# Patient Record
Sex: Female | Born: 1960 | Race: Black or African American | Marital: Single | Smoking: Current every day smoker
Health system: Northeastern US, Academic
[De-identification: ages and names within clinical notes are randomized; demographics above are authoritative.]

## PROBLEM LIST (undated history)

## (undated) DIAGNOSIS — I10 Essential (primary) hypertension: Secondary | ICD-10-CM

---

## 2019-05-14 ENCOUNTER — Encounter: Payer: Medicare Other | Admitting: Cardiology

## 2019-05-14 ENCOUNTER — Inpatient Hospital Stay
Admission: EM | Admit: 2019-05-14 | Discharge: 2019-05-17 | DRG: 178 | Disposition: A | Discharge status: Home / Self Care | Payer: Medicare Other | Source: Ambulatory Visit | Attending: Internal Medicine | Admitting: Internal Medicine

## 2019-05-14 ENCOUNTER — Other Ambulatory Visit: Payer: Self-pay | Admitting: Cardiology

## 2019-05-14 ENCOUNTER — Ambulatory Visit
Admission: AD | Admit: 2019-05-14 | Discharge: 2019-05-14 | Disposition: A | Discharge status: Other Acute Inpatient Hospital | Payer: Medicare Other | Source: Ambulatory Visit | Attending: Thoracic Diseases | Admitting: Thoracic Diseases

## 2019-05-14 ENCOUNTER — Ambulatory Visit: Admit: 2019-05-14 | Discharge: 2019-05-14 | Disposition: A | Discharge status: Home / Self Care | Payer: Medicare Other

## 2019-05-14 ENCOUNTER — Encounter: Payer: Self-pay | Admitting: Emergency Medicine

## 2019-05-14 DIAGNOSIS — R05 Cough: Secondary | ICD-10-CM

## 2019-05-14 DIAGNOSIS — Z20828 Contact with and (suspected) exposure to other viral communicable diseases: Secondary | ICD-10-CM | POA: Insufficient documentation

## 2019-05-14 DIAGNOSIS — R0602 Shortness of breath: Secondary | ICD-10-CM

## 2019-05-14 DIAGNOSIS — Z8619 Personal history of other infectious and parasitic diseases: Secondary | ICD-10-CM

## 2019-05-14 DIAGNOSIS — I10 Essential (primary) hypertension: Secondary | ICD-10-CM | POA: Diagnosis present

## 2019-05-14 DIAGNOSIS — R531 Weakness: Secondary | ICD-10-CM

## 2019-05-14 DIAGNOSIS — F17210 Nicotine dependence, cigarettes, uncomplicated: Secondary | ICD-10-CM | POA: Diagnosis present

## 2019-05-14 DIAGNOSIS — E876 Hypokalemia: Secondary | ICD-10-CM | POA: Diagnosis not present

## 2019-05-14 DIAGNOSIS — R509 Fever, unspecified: Secondary | ICD-10-CM

## 2019-05-14 DIAGNOSIS — J189 Pneumonia, unspecified organism: Secondary | ICD-10-CM | POA: Insufficient documentation

## 2019-05-14 DIAGNOSIS — R000 Tachycardia, unspecified: Secondary | ICD-10-CM

## 2019-05-14 DIAGNOSIS — R5383 Other fatigue: Secondary | ICD-10-CM

## 2019-05-14 DIAGNOSIS — N179 Acute kidney failure, unspecified: Secondary | ICD-10-CM | POA: Diagnosis not present

## 2019-05-14 DIAGNOSIS — R61 Generalized hyperhidrosis: Secondary | ICD-10-CM

## 2019-05-14 DIAGNOSIS — A481 Legionnaires' disease: Principal | ICD-10-CM | POA: Diagnosis present

## 2019-05-14 HISTORY — DX: Essential (primary) hypertension: I10

## 2019-05-14 LAB — CBC AND DIFFERENTIAL
Baso # K/uL: 0.1 10*3/uL (ref 0.0–0.1)
Basophil %: 0.6 %
Eos # K/uL: 0 10*3/uL (ref 0.0–0.4)
Eosinophil %: 0 %
Hematocrit: 45 % (ref 34–45)
Hemoglobin: 14.3 g/dL (ref 11.2–15.7)
IMM Granulocytes #: 0.2 10*3/uL — ABNORMAL HIGH (ref 0.0–0.0)
IMM Granulocytes: 1.4 %
Lymph # K/uL: 1.9 10*3/uL (ref 1.2–3.7)
Lymphocyte %: 12.9 %
MCH: 25 pg — ABNORMAL LOW (ref 26–32)
MCHC: 32 g/dL (ref 32–36)
MCV: 78 fL — ABNORMAL LOW (ref 79–95)
Mono # K/uL: 1 10*3/uL — ABNORMAL HIGH (ref 0.2–0.9)
Monocyte %: 6.8 %
Neut # K/uL: 11.3 10*3/uL — ABNORMAL HIGH (ref 1.6–6.1)
Nucl RBC # K/uL: 0 10*3/uL (ref 0.0–0.0)
Nucl RBC %: 0 /100 WBC (ref 0.0–0.2)
Platelets: 179 10*3/uL (ref 160–370)
RBC: 5.8 MIL/uL — ABNORMAL HIGH (ref 3.9–5.2)
RDW: 15.7 % — ABNORMAL HIGH (ref 11.7–14.4)
Seg Neut %: 78.3 %
WBC: 14.5 10*3/uL — ABNORMAL HIGH (ref 4.0–10.0)

## 2019-05-14 LAB — POCT URINALYSIS DIPSTICK
Exp date: 5
Glucose,UA POCT: NORMAL mg/dL
Leuk Esterase,UA POCT: NEGATIVE
Lot #: 43799603
Nitrite,UA POCT: NEGATIVE
PH,UA POCT: 5 (ref 5–8)
Specific gravity,UA POCT: 1.015 (ref 1.002–1.030)
Urobilinogen,UA: 4 mg/dL

## 2019-05-14 LAB — LACTATE, VENOUS, WHOLE BLOOD: Lactate VEN,WB: 1.8 mmol/L (ref 0.5–2.2)

## 2019-05-14 LAB — BASIC METABOLIC PANEL
Anion Gap: 17 — ABNORMAL HIGH (ref 7–16)
CO2: 25 mmol/L (ref 20–28)
Calcium: 8.7 mg/dL (ref 8.6–10.2)
Chloride: 97 mmol/L (ref 96–108)
Creatinine: 1.89 mg/dL — ABNORMAL HIGH (ref 0.51–0.95)
GFR,Black: 33 * — AB
GFR,Caucasian: 29 * — AB
Glucose: 131 mg/dL — ABNORMAL HIGH (ref 60–99)
Lab: 42 mg/dL — ABNORMAL HIGH (ref 6–20)
Potassium: 3.1 mmol/L — ABNORMAL LOW (ref 3.3–5.1)
Sodium: 139 mmol/L (ref 133–145)

## 2019-05-14 LAB — RSV PCR: RSV PCR: 0

## 2019-05-14 LAB — PERFORMING LAB

## 2019-05-14 LAB — INFLUENZA A: Influenza A PCR: 0

## 2019-05-14 LAB — NT-PRO BNP: NT-pro BNP: <50 pg/mL (ref 0–900)

## 2019-05-14 LAB — COVID-19 PCR

## 2019-05-14 LAB — INFLUENZA B PCR: Influenza B PCR: 0

## 2019-05-14 LAB — COVID-19 NAAT (PCR): COVID-19 NAAT (PCR): NEGATIVE

## 2019-05-14 MED ORDER — DOXYCYCLINE 100 MG / 110 ML NS *I*
100.0000 mg | Freq: Once | INTRAVENOUS | Status: AC
Start: 2019-05-14 — End: 2019-05-15
  Administered 2019-05-14: 100 mg via INTRAVENOUS
  Filled 2019-05-14: qty 100

## 2019-05-14 MED ORDER — DEXTROSE 5 % FLUSH FOR PUMPS *I*
0.0000 mL/h | INTRAVENOUS | Status: DC | PRN
Start: 2019-05-14 — End: 2019-05-17

## 2019-05-14 MED ORDER — LISINOPRIL 5 MG PO TABS *I*
10.0000 mg | ORAL_TABLET | Freq: Every day | ORAL | Status: DC
Start: 2019-05-15 — End: 2019-05-14

## 2019-05-14 MED ORDER — SODIUM CHLORIDE 0.9 % IV BOLUS *I*
500.0000 mL | Freq: Once | Status: AC
Start: 2019-05-14 — End: 2019-05-14
  Administered 2019-05-14: 500 mL via INTRAVENOUS

## 2019-05-14 MED ORDER — SODIUM CHLORIDE 0.9 % FLUSH FOR PUMPS *I*
0.0000 mL/h | INTRAVENOUS | Status: DC | PRN
Start: 2019-05-14 — End: 2019-05-17
  Administered 2019-05-15 (×2): 30 mL/h
  Administered 2019-05-15: 30 mL/h via INTRAVENOUS

## 2019-05-14 MED ORDER — ACETAMINOPHEN 325 MG PO TABS *I*
650.0000 mg | ORAL_TABLET | Freq: Once | ORAL | Status: AC
Start: 2019-05-14 — End: 2019-05-14
  Administered 2019-05-14: 650 mg via ORAL

## 2019-05-14 MED ORDER — CEFTRIAXONE SODIUM 1 G IN STERILE WATER 10ML SYRINGE *I*
1000.0000 mg | INTRAVENOUS | Status: DC
Start: 2019-05-15 — End: 2019-05-16
  Administered 2019-05-15: 1000 mg via INTRAVENOUS
  Filled 2019-05-14: qty 10

## 2019-05-14 MED ORDER — DOXYCYCLINE 100 MG / 110 ML NS *I*
100.0000 mg | Freq: Two times a day (BID) | INTRAVENOUS | Status: DC
Start: 2019-05-15 — End: 2019-05-17
  Administered 2019-05-15 – 2019-05-17 (×5): 100 mg via INTRAVENOUS
  Filled 2019-05-14 (×7): qty 100

## 2019-05-14 MED ORDER — ACETAMINOPHEN 325 MG PO TABS *I*
650.0000 mg | ORAL_TABLET | Freq: Four times a day (QID) | ORAL | Status: DC | PRN
Start: 2019-05-14 — End: 2019-05-15
  Administered 2019-05-15: 650 mg via ORAL
  Filled 2019-05-14: qty 2

## 2019-05-14 MED ORDER — SODIUM CHLORIDE 0.9 % FLUSH FOR PUMPS *I*
0.0000 mL/h | INTRAVENOUS | Status: DC | PRN
Start: 2019-05-14 — End: 2019-05-17

## 2019-05-14 MED ORDER — AMLODIPINE BESYLATE 5 MG PO TABS *I*
5.0000 mg | ORAL_TABLET | Freq: Every day | ORAL | Status: DC
Start: 2019-05-15 — End: 2019-05-17
  Administered 2019-05-15 – 2019-05-17 (×3): 5 mg via ORAL
  Filled 2019-05-14 (×3): qty 1

## 2019-05-14 MED ORDER — CEFTRIAXONE SODIUM 1 GM IV/IJ SOLR *WRAPPED*
1.0000 g | Freq: Once | INTRAMUSCULAR | Status: AC
Start: 2019-05-14 — End: 2019-05-14
  Administered 2019-05-14: 1 g via INTRAMUSCULAR

## 2019-05-14 MED ORDER — ENOXAPARIN SODIUM 40 MG/0.4ML IJ SOSY *I*
40.0000 mg | PREFILLED_SYRINGE | Freq: Every day | INTRAMUSCULAR | Status: DC
Start: 2019-05-14 — End: 2019-05-17
  Administered 2019-05-14 – 2019-05-16 (×3): 40 mg via SUBCUTANEOUS
  Filled 2019-05-14 (×3): qty 0.4

## 2019-05-14 MED ORDER — CEFTRIAXONE SODIUM 1 G IN STERILE WATER 10ML SYRINGE *I*
1000.0000 mg | Freq: Once | INTRAVENOUS | Status: AC
Start: 2019-05-14 — End: 2019-05-14
  Administered 2019-05-14: 1000 mg via INTRAVENOUS
  Filled 2019-05-14: qty 10

## 2019-05-14 MED ORDER — POTASSIUM CHLORIDE CRYS CR 20 MEQ PO TBCR *I*
40.0000 meq | ORAL_TABLET | Freq: Once | ORAL | Status: AC
Start: 2019-05-14 — End: 2019-05-14
  Administered 2019-05-14: 40 meq via ORAL
  Filled 2019-05-14: qty 2

## 2019-05-14 MED ORDER — ENOXAPARIN SODIUM 40 MG/0.4ML IJ SOSY *I*
40.0000 mg | PREFILLED_SYRINGE | Freq: Every day | INTRAMUSCULAR | Status: DC
Start: 2019-05-14 — End: 2019-05-14

## 2019-05-14 MED ORDER — ENOXAPARIN SODIUM 30 MG/0.3ML IJ SOSY *I*
30.0000 mg | PREFILLED_SYRINGE | Freq: Every day | INTRAMUSCULAR | Status: DC
Start: 2019-05-14 — End: 2019-05-14

## 2019-05-14 NOTE — ED Triage Notes (Signed)
Pt c/o weakness, loss of appetite, fatigue, insomnia, and diaphoresis x6-7 days, states she had covid-19 in July.       Triage Note   Allen Kell, RN

## 2019-05-14 NOTE — ED Notes (Signed)
Bed: A-06L  Expected date:   Expected time:   Means of arrival:   Comments:

## 2019-05-14 NOTE — ED Notes (Signed)
05/14/19 1340   Expected Call-In Information   ED Service [facility] Adult Call-in   Call received from [place]? No   Pt Info note/Reason for sending weakness, loss of apetite, fatigue for 6-7 days, covid tested today   Pt Coming from Urgent Care   Temp 37.7 C (99.9 F)   Call reported to cp

## 2019-05-14 NOTE — ED Provider Notes (Signed)
History     Chief Complaint   Patient presents with    Pneumonia     pt sent in from urgent care,  for pnuemonia, and tachycardia.  pt is diaphoretic in triage.  pt is covid positive as of today.     Cassandra Randall is a 58 y.o. female with h/o HTN, smoker.     She has been feel generally weak for about he past 4-5 days. So weak that she has no interest in going outside to smoke cigarettes or smoke marijuana (her sister does not allow her to smoke indoors). She has had decreased appetite for the past two days. Associated with fever, sweating, a cough, "slight" shortness of breath.    She lives in [place] and is visiting [place]. She traveled here about 8 days ago.     She had a Covid-19 infection in July.           Medical/Surgical/Family History     Past Medical History:   Diagnosis Date    Hypertension         There is no problem list on file for this patient.           History reviewed. No pertinent surgical history.  No family history on file.       Social History     Tobacco Use    Smoking status: Current Every Day Smoker    Smokeless tobacco: Never Used   Substance Use Topics    Alcohol use: Not on file    Drug use: Yes     Types: Marijuana     Living Situation     Questions Responses    Patient lives with     Homeless     Caregiver for other family member     External Services     Employment     Domestic Violence Risk                 Review of Systems   Review of Systems   Constitutional: Positive for appetite change, diaphoresis, fatigue and fever.   HENT: Negative for sore throat.    Eyes: Negative for visual disturbance.   Respiratory: Positive for cough. Negative for shortness of breath.    Cardiovascular: Negative for chest pain.   Gastrointestinal: Negative for abdominal pain, nausea and vomiting.   Genitourinary: Negative for decreased urine volume and dysuria.   Skin: Negative for rash.   Neurological: Negative for syncope and light-headedness.   Psychiatric/Behavioral: Negative for  confusion.       Physical Exam     Triage Vitals  Triage Start: Start, (05/14/19 1409)   First Recorded BP: 114/65, Resp: 18, Temp: 36.7 C (98 F), Temp src: TEMPORAL Oxygen Therapy SpO2: 98 %, Oximetry Source: Rt Hand, O2 Device: None (Room air), Heart Rate: (!) 117, (05/14/19 1410)  .  First Pain Reported  0-10 Scale: 0, (05/14/19 1410)       Physical Exam  Vitals signs and nursing note reviewed.   Constitutional:       General: She is not in acute distress.     Appearance: She is well-developed. She is diaphoretic.   HENT:      Head: Normocephalic and atraumatic.      Right Ear: External ear normal.      Left Ear: External ear normal.   Eyes:      General: No scleral icterus.        Right eye: No discharge.  Left eye: No discharge.      Conjunctiva/sclera: Conjunctivae normal.   Neck:      Musculoskeletal: Normal range of motion and neck supple.   Cardiovascular:      Rate and Rhythm: Regular rhythm. Tachycardia present.      Heart sounds: Normal heart sounds.      Comments: Tachycardia at arrival. HR in the 90s on telemetry during exam.   Pulmonary:      Effort: Pulmonary effort is normal. No respiratory distress.      Breath sounds: Normal breath sounds.      Comments: Breathing easily and comfortably. Speaking in full sentences without issue.   Abdominal:      General: There is no distension.   Musculoskeletal: Normal range of motion.   Skin:     General: Skin is warm.   Neurological:      Mental Status: She is alert.      Motor: No abnormal muscle tone.   Psychiatric:         Behavior: Behavior normal.         Medical Decision Making   Patient seen by me on:  05/14/2019    Assessment:  Cassandra Randall is a 58 y.o. female who presents with fever, cough, generalized fatigue, and decreased appetite. She has mild tachycardia presently with otherwise normal vitals and no fever (she did receive tylenol at urgent care). Overall, she is not toxic appearing or in any acute distress, she is breathing comfortably  and maintaining an adequate spO2 on room air. Her set of symptoms are the most suspicious for Covid; however, she reports having a Covid infection in July. This could alternatively be a non-Covid viral respiratory infection or CAP. Will check labs and a covid swab during her visit to evaluate further.     Differential diagnosis:  Viral illness, URI, Covid, PNA, leukocytosis, metabolic disturbance.     Plan:  Diagnostics: cbc, bmp, covid swab; CXR from UC reviewed   Therapeutic: IVFs      EKG Interpretation: Rhythm strip was interpreted by me: Normal Sinus Rhythm, rate 99               Salem Senate, MD          Salem Senate, MD  05/14/19 1530

## 2019-05-14 NOTE — ED Notes (Signed)
Report Given To  Kieth Brightly OU RN      Descriptive Sentence / Reason for Admission   Admit for PNA      Active Issues / Relevant Events   A&O  Room Air  Independent  20g R wrist  20g L hand  Doxy running  Pills whole      To Do List  Medications per [facility]  Order Kardex  Urine      Anticipatory Guidance / Discharge Planning  Admit  From home

## 2019-05-14 NOTE — H&P (Signed)
MEDICINE HISTORY & PHYSICAL    SUBJECTIVE:  05/14/2019  2:21 PM    CC: shortness of breath    HPI: Elody Kleinsasser is a 58 y.o. female with hx of hypertension, tobacco use, and COVID pneumonia in July presenting with 5 days of worsening dyspnea, cough, and warmth feeling.     She is visiting from [place] and has been here for past 10 days. She found out that she had exposure to COVID from her cousin. Reports cough, shortness of breath, and maybe fever. Denies orthopnea or chest pain. Continues to use cigarettes.She presented to urgent care and due to concerns of LLL pneumonia that was detected on chest xray she was transferred to [facility] for further evaluation.     Here, she is COVID/influenaze negative. Lactate 1.8, Na 139, K3.1, Cr 1.89 (unknown baseline), WBC 14.5. Chest Xray again showing LLL opacity concerning for pneumonia. She was started on Ceftriaxone and Doxy.     Otherwise reports headaches that are chronic. Denies chest pain, palpitations, or known cardiac disease. Denies lower extremity swelling. Denies urinary symptoms and no evidence of bleeding. Reports intermittent constipation.     PMHx:  Past Medical History:   Diagnosis Date    Hypertension        PSHx:  History reviewed. No pertinent surgical history.    FHx:  No family history on file.    SHx:  Social History     Socioeconomic History    Marital status: Single     Spouse name: Not on file    Number of children: Not on file    Years of education: Not on file    Highest education level: Not on file   Occupational History    Not on file   Tobacco Use    Smoking status: Current Every Day Smoker    Smokeless tobacco: Never Used   Substance and Sexual Activity    Alcohol use: Not on file    Drug use: Yes     Types: Marijuana    Sexual activity: Not on file   Social History Narrative    Not on file         Allergies:  has No Known Allergies (drug, envir, food or latex).    Medications:  Home Meds:   Prior to Admission medications    Medication  Sig Start Date End Date Taking? Authorizing Provider   amLODIPine (NORVASC) 10 MG tablet Take 5 mg by mouth daily    Yes [provider]   lisinopril (PRINIVIL,ZESTRIL) 10 MG tablet Take 10 mg by mouth daily   Yes [provider]     Scheduled Meds:    cefTRIAXone  1,000 mg Intravenous Once    doxycycline IV  100 mg Intravenous Once    [START ON 05/15/2019] amLODIPine  5 mg Oral Daily    [START ON 05/15/2019] lisinopril  10 mg Oral Daily    enoxaparin  40 mg Subcutaneous Daily @ 2100    [START ON 05/15/2019] cefTRIAXone  1,000 mg Intravenous Q24H    [START ON 05/15/2019] doxycycline IV  100 mg Intravenous BID    potassium chloride SA  40 mEq Oral Once     Continuous Infusions:   PRN Meds: sodium chloride, dextrose, sodium chloride, dextrose, acetaminophen  PTA Meds: (Not in a hospital admission)        ROS:   - see per HPI     OBJECTIVE:    Vital Signs:  BP: (114-141)/(65-89)   Temp:  [  36.7 C (98 F)-37.7 C (99.9 F)]   Temp src: Oral (12/25 1440)  Heart Rate:  [98-125]   Resp:  [18-24]   SpO2:  [98 %-100 %]   Height:  [165.1 cm (5\' 5" )]   Weight:  [81.6 kg (180 lb)]   Patient Vitals for the past 72 hrs:   BP Temp Temp src Pulse Resp SpO2 Height Weight   05/14/19 1651 141/89 37.3 C (99.1 F) -- 98 22 100 % -- --   05/14/19 1554 128/74 -- -- 98 18 99 % -- --   05/14/19 1440 -- 37.5 C (99.5 F) Oral -- -- -- -- --   05/14/19 1410 114/65 36.7 C (98 F) TEMPORAL (!) 117 18 98 % 1.651 m (5\' 5" ) 81.6 kg (180 lb)              Intake/Output Summary (Last 24 hours) at 05/14/2019 2006  Last data filed at 05/14/2019 1643  Gross per 24 hour   Intake 99 ml   Output --   Net 99 ml     Weight:   Last 4 Weights    05/14/19 1410   Weight: 81.6 kg (180 lb)     Auscultation done with disposable stethoscope.     Gen:  NAD, appears stated age   HENT: wearing a mask  Lungs: Clear to auscultation, no dullness to percusion, mild rales in left lung  Heart: RRR   Abd: BS+, soft, NT, ND, no HSM  Ext: No  e/c/c.  Neuro: A&Ox3, grossly normal     Lab Results:    No intake/output data recorded.  Recent Labs     05/14/19  1555   Sodium 139   Potassium 3.1*   Chloride 97   CO2 25   Creatinine 1.89*   Calcium 8.7   Glucose 131*     No components found with this basename: BUN    Recent Labs   Lab 05/14/19  1555   Calcium 8.7     Recent Labs     05/14/19  1555   WBC 14.5*   RBC 5.8*   Hemoglobin 14.3   Hematocrit 45   MCV 78*   RDW 15.7*   Platelets 179     No results for input(s): ALT, AST, INR in the last 168 hours.    No components found with this basename: ALKPHOS, BILITOT, BILIDIR, PROT, LABALBU, APT  No components found with this basename: CKTOTAL, TROPONINI, TROPONINT, CKMBINDEX  No results for input(s): PGLU in the last 168 hours.          ASSESSMENT/PLAN: Karenann CaiShantia Isensee is a 58 y.o. female w/ a history of hypertension, tobacco use, COVID pneumonia in July presenting with dyspnea and subjective fever with known recent COVID exposure. She tested negative for COVID-19/influenzae; however, chest Xray is concerning for LLL pneumonia.     Dyspnea, likely CAP  - Cont Ceftriaxone/Doxy  - Strep pneumo, legionella   - No evidence of heart failure on exam, will obtain BNP   - Tylenol prn     Elevated Cr  - unknown baseline   - UA   - would obtain records in am   - stop lisinopril for now     Hypertension  - cont home amlodipine   - hold lisinopril as mentioned above    F - oral   E - daily   N - regular     Dvt ppx: regular     Code Status: Full  Clare Charon, MD     This encounter was not billed

## 2019-05-14 NOTE — ED Notes (Signed)
Report called to [facility] at [facility] ED, pt headed there via private vehicle. Discharge instructions reviewed, pt verbalized understanding. All questions answered. Pt left with all belongings.

## 2019-05-14 NOTE — ED Notes (Signed)
Bed: A-06R  Expected date:   Expected time:   Means of arrival:   Comments:

## 2019-05-14 NOTE — UC Provider Note (Signed)
History     Chief Complaint   Patient presents with    Illness     Pt c/o weakness, loss of appetite, fatigue, insomnia, and diaphoresis x6-7 days, states she had covid-19 in July.     58 year old female who recently traveled here from [place] to help her sister following surgery performed at [facility] presents to urgent care for further evaluation of fatigue, generalized weakness, poor p.o. intake, insomnia and diaphoresis for the past 6 to 7 days.  Patient denies any known sick contacts.  Patient did have Covid infection back in July.  Patient denies any other associated symptoms.  No cough, chest pressure, chest tightness or shortness of breath.  Patient does endorse dark appearing urine.  No burning, urgency or frequency.  No abdominal pain, nausea or vomiting.  No back or flank pain.  Patient denies any underlying cardiopulmonary disease.  Patient is a current smoker.  She does also have history of HTN.           Medical/Surgical/Family History     Past Medical History:   Diagnosis Date    Hypertension         There is no problem list on file for this patient.           History reviewed. No pertinent surgical history.  No family history on file.       Social History     Tobacco Use    Smoking status: Current Every Day Smoker    Smokeless tobacco: Never Used   Substance Use Topics    Alcohol use: Not on file    Drug use: Yes     Types: Marijuana     Living Situation     Questions Responses    Patient lives with     Homeless     Caregiver for other family member     External Services     Employment     Domestic Violence Risk                 Review of Systems   Review of Systems   Constitutional: Positive for activity change, appetite change, diaphoresis and fever. Negative for chills.   HENT: Negative for congestion.    Respiratory: Negative for chest tightness and shortness of breath.    Cardiovascular: Negative for chest pain and palpitations.   Gastrointestinal: Negative for abdominal pain,  nausea and vomiting.   Musculoskeletal: Positive for myalgias. Negative for neck stiffness.   Skin: Negative for color change and rash.   Neurological: Negative for dizziness, light-headedness and headaches.   Psychiatric/Behavioral: Negative for confusion.       Physical Exam   Triage Vitals  Triage Start: Start, (05/14/19 1251)   First Recorded BP: 123/73, Resp: 24, Temp: 37.4 C (99.4 F) Oxygen Therapy SpO2: 100 %, Heart Rate: (!) 125, (05/14/19 1252)  .  First Pain Reported  0-10 Scale: 0, (05/14/19 1252)       Physical Exam  Vitals signs and nursing note reviewed.   Constitutional:       Appearance: She is ill-appearing and diaphoretic.   HENT:      Head: Normocephalic.   Eyes:      Conjunctiva/sclera: Conjunctivae normal.   Neck:      Musculoskeletal: Normal range of motion.   Cardiovascular:      Rate and Rhythm: Tachycardia present.      Heart sounds: Normal heart sounds, S1 normal and S2 normal.   Pulmonary:  Effort: No respiratory distress.      Breath sounds: Examination of the left-middle field reveals rhonchi. Examination of the right-lower field reveals decreased breath sounds. Examination of the left-lower field reveals decreased breath sounds and rhonchi. Decreased breath sounds and rhonchi present.   Abdominal:      Palpations: Abdomen is soft.      Tenderness: There is no abdominal tenderness.   Musculoskeletal: Normal range of motion.   Skin:     General: Skin is warm.      Coloration: Skin is not pale.   Neurological:      Mental Status: She is alert and oriented to person, place, and time.      GCS: GCS eye subscore is 4. GCS verbal subscore is 5. GCS motor subscore is 6.   Psychiatric:         Mood and Affect: Mood normal.          Medical Decision Making        Initial Evaluation:  ED First Provider Contact     Date/Time Event User Comments    05/14/19 1247 ED First Provider Contact Daleiza Bacchi A Initial Face to Face Provider Contact          Patient was seen on:  05/14/2019        Assessment:  58 y.o.female comes to the Urgent Care [facility] with fatigue, decreased PO intake, diaphoresis and fever for the past 5-6 days. Patient Covid +in July. Just traveled here from [place].     Differential Diagnosis includes:  PNA, early sepsis, dehydration, AKI, electrolyte abnormality, covid     Plan: Patient with fatigue, fever, decreased PO intake and questionable pneumonia vs pulmonary edema on chest xray imaging. Patient tachycardiac and diaphoretic on exam despite Tylenol. Mental status intact.  Urine with blood, bilrubin and large amount of protein. EKG reveals ST.     Probable PNA of LLL. No change in vital signs after Tylenol and watchful waiting. Writer not convinced patient can tolerate trial of PO antibiotics at home for treatment of suspected PNA. Recommended further work-up in ED. Patient hesitant but agreeable after discussing reasoning as documented below...    Patient needs lab work to rule out early sepsis or AKI prior to sending home on PO antibiotics. Also possible IV rehydration.  Covid obtained while here in UC. 1 gram IV Rocephin given at 1325. Patient is mentating well but no change in vital signs throughout visit and she remains visibly diaphoretic (patient tells me she is diaphoretic at baseline). Oxygen and RR stable.           Orders Placed This Encounter    Bacterial urine culture    *Chest standard frontal and lateral views    COVID-19 PCR    POCT urinalysis dipstick    EKG 12 lead (initial)    acetaminophen (TYLENOL) tablet 650 mg    cefTRIAXone (ROCEPHIN) 100 mg/ml injection 1 g       Recent Results (from the past 24 hour(s))   POCT urinalysis dipstick    Collection Time: 05/14/19  1:16 PM   Result Value Ref Range    Specific gravity,UA POCT 1.015 1.002 - 1.030    PH,UA POCT 5.0 5 - 8    Leuk Esterase,UA POCT Negative Negative    Nitrite,UA POCT Negative Negative    Protein,UA POCT ++ 100mg /dL (!) Negative mg/dL    Glucose,UA POCT Normal Normal mg/dL     Ketones,UA POCT + small Negative mg/dL  Urobilinogen,UA 4 mg/dL Less than 1 mg/dL    Bilirubin,Ur + (!) Negative    Blood,UA POCT About 250 (!) Negative    Exp date 05 21     Lot # [id number]            Final Diagnosis    ICD-10-CM ICD-9-CM   1. Pneumonia of left lower lobe due to infectious organism  J18.9 486       Current Discharge Medication List            Final Diagnosis  Final diagnoses:   [J18.9] Pneumonia of left lower lobe due to infectious organism (Primary)         Artelia LarocheElizabeth A Sarha Bartelt, NP              Artelia LarocheHayes, Taejah Ohalloran A, NP  05/14/19 1402

## 2019-05-14 NOTE — ED Triage Notes (Signed)
pt sent in from urgent care,  for pnuemonia, and tachycardia.  pt is diaphoretic in triage.  pt is covid positive as of today.    Triage Note   Elliot Cousin, RN

## 2019-05-15 DIAGNOSIS — I10 Essential (primary) hypertension: Secondary | ICD-10-CM

## 2019-05-15 DIAGNOSIS — A481 Legionnaires' disease: Principal | ICD-10-CM

## 2019-05-15 DIAGNOSIS — R042 Hemoptysis: Secondary | ICD-10-CM

## 2019-05-15 LAB — HEPATIC FUNCTION PANEL
ALT: 36 U/L — ABNORMAL HIGH (ref 0–35)
AST: 72 U/L — ABNORMAL HIGH (ref 0–35)
Albumin: 3.8 g/dL (ref 3.5–5.2)
Alk Phos: 79 U/L (ref 35–105)
Bilirubin,Direct: <0.2 mg/dL (ref 0.0–0.3)
Bilirubin,Total: 0.3 mg/dL (ref 0.0–1.2)
Total Protein: 7.7 g/dL (ref 6.3–7.7)

## 2019-05-15 LAB — BASIC METABOLIC PANEL
Anion Gap: 15 (ref 7–16)
CO2: 25 mmol/L (ref 20–28)
Calcium: 8.6 mg/dL (ref 8.6–10.2)
Chloride: 98 mmol/L (ref 96–108)
Creatinine: 1.55 mg/dL — ABNORMAL HIGH (ref 0.51–0.95)
GFR,Black: 42 * — AB
GFR,Caucasian: 36 * — AB
Glucose: 99 mg/dL (ref 60–99)
Lab: 40 mg/dL — ABNORMAL HIGH (ref 6–20)
Sodium: 138 mmol/L (ref 133–145)

## 2019-05-15 LAB — CBC AND DIFFERENTIAL
Baso # K/uL: 0.1 10*3/uL (ref 0.0–0.1)
Basophil %: 0.5 %
Eos # K/uL: 0 10*3/uL (ref 0.0–0.4)
Eosinophil %: 0 %
Hematocrit: 45 % (ref 34–45)
Hemoglobin: 14.1 g/dL (ref 11.2–15.7)
IMM Granulocytes #: 0.2 10*3/uL — ABNORMAL HIGH (ref 0.0–0.0)
IMM Granulocytes: 1.5 %
Lymph # K/uL: 2.9 10*3/uL (ref 1.2–3.7)
Lymphocyte %: 18.1 %
MCH: 25 pg — ABNORMAL LOW (ref 26–32)
MCHC: 32 g/dL (ref 32–36)
MCV: 78 fL — ABNORMAL LOW (ref 79–95)
Mono # K/uL: 1.1 10*3/uL — ABNORMAL HIGH (ref 0.2–0.9)
Monocyte %: 7.3 %
Neut # K/uL: 11.4 10*3/uL — ABNORMAL HIGH (ref 1.6–6.1)
Nucl RBC # K/uL: 0 10*3/uL (ref 0.0–0.0)
Nucl RBC %: 0 /100 WBC (ref 0.0–0.2)
Platelets: 192 10*3/uL (ref 160–370)
RBC: 5.7 MIL/uL — ABNORMAL HIGH (ref 3.9–5.2)
RDW: 15.7 % — ABNORMAL HIGH (ref 11.7–14.4)
Seg Neut %: 72.6 %
WBC: 15.7 10*3/uL — ABNORMAL HIGH (ref 4.0–10.0)

## 2019-05-15 LAB — S. PNEUMONIAE ANTIGEN: S. pneumoniae Antigen: 0

## 2019-05-15 LAB — COVID-19 NAAT (PCR): COVID-19 NAAT (PCR): NEGATIVE

## 2019-05-15 LAB — LEGIONELLA ANTIGEN, URINE: Legionella Antigen (Urine): POSITIVE — AB

## 2019-05-15 LAB — COVID-19 PCR

## 2019-05-15 LAB — AEROBIC CULTURE: Aerobic Culture: 0

## 2019-05-15 MED ORDER — ACETAMINOPHEN 500 MG PO TABS *I*
500.0000 mg | ORAL_TABLET | Freq: Once | ORAL | Status: AC
Start: 2019-05-15 — End: 2019-05-15
  Administered 2019-05-15: 500 mg via ORAL
  Filled 2019-05-15: qty 1

## 2019-05-15 MED ORDER — ACETAMINOPHEN 325 MG PO TABS *I*
650.0000 mg | ORAL_TABLET | Freq: Four times a day (QID) | ORAL | Status: DC | PRN
Start: 2019-05-15 — End: 2019-05-17
  Administered 2019-05-15 – 2019-05-16 (×3): 650 mg via ORAL
  Filled 2019-05-15 (×4): qty 2

## 2019-05-15 NOTE — Progress Notes (Signed)
Notified by micro lab that 1/4 culture bottle is positive with large gram + bacilli.  A/P - patient admitted with CAP, on CTX/doxy. Continue the course.

## 2019-05-15 NOTE — Progress Notes (Signed)
05/14/19 1909   [facility] Patient Class Review   Patient Class Review Inpatient

## 2019-05-15 NOTE — Plan of Care (Signed)
Problem: Safety  Goal: Patient will remain free of falls  Outcome: Maintaining    Problem: Pain/Comfort  Goal: Patient’s pain or discomfort is manageable  Outcome: Maintaining    Problem: Nutrition  Goal: Patient’s nutritional status is maintained or improved  Outcome: Maintaining    Problem: Mobility  Goal: Patient’s functional status is maintained or improved  Outcome: Maintaining    Problem: Psychosocial  Goal: Demonstrates ability to cope with illness  Outcome: Maintaining    Problem: Cognitive function  Goal: Cognitive function will be maintained or return to baseline  Outcome: Maintaining

## 2019-05-15 NOTE — Progress Notes (Signed)
Temp at 0004 was 101/9 oral  And tylenol was given at 0055. Rechecked temp at 0253 was 100.6 oral. Text covering provider Dr. Doreen Beam at [phone] andnew order for x1 dose of tylenol at 0400 and clarified current tylenol order. Tylenol given at 0357 and recheck temp at 0643 was 100.8. Text provider to update on pt status, pebding response

## 2019-05-15 NOTE — Progress Notes (Signed)
Current state of emergency related to COVID-19. Documentation completed per hospital emergency response standard.                  Neriah Brott, RN

## 2019-05-16 LAB — CBC AND DIFFERENTIAL
Baso # K/uL: 0.1 10*3/uL (ref 0.0–0.1)
Basophil %: 0.4 %
Eos # K/uL: 0.1 10*3/uL (ref 0.0–0.4)
Eosinophil %: 0.4 %
Hematocrit: 41 % (ref 34–45)
Hemoglobin: 12.9 g/dL (ref 11.2–15.7)
IMM Granulocytes #: 0.3 10*3/uL — ABNORMAL HIGH (ref 0.0–0.0)
IMM Granulocytes: 2.1 %
Lymph # K/uL: 2.2 10*3/uL (ref 1.2–3.7)
Lymphocyte %: 15.9 %
MCH: 25 pg — ABNORMAL LOW (ref 26–32)
MCHC: 32 g/dL (ref 32–36)
MCV: 78 fL — ABNORMAL LOW (ref 79–95)
Mono # K/uL: 1.2 10*3/uL — ABNORMAL HIGH (ref 0.2–0.9)
Monocyte %: 8.6 %
Neut # K/uL: 10.1 10*3/uL — ABNORMAL HIGH (ref 1.6–6.1)
Nucl RBC # K/uL: 0 10*3/uL (ref 0.0–0.0)
Nucl RBC %: 0 /100 WBC (ref 0.0–0.2)
Platelets: 196 10*3/uL (ref 160–370)
RBC: 5.3 MIL/uL — ABNORMAL HIGH (ref 3.9–5.2)
RDW: 15.4 % — ABNORMAL HIGH (ref 11.7–14.4)
Seg Neut %: 72.6 %
WBC: 13.9 10*3/uL — ABNORMAL HIGH (ref 4.0–10.0)

## 2019-05-16 LAB — BASIC METABOLIC PANEL
Anion Gap: 16 (ref 7–16)
CO2: 24 mmol/L (ref 20–28)
Calcium: 8.5 mg/dL — ABNORMAL LOW (ref 8.6–10.2)
Chloride: 97 mmol/L (ref 96–108)
Creatinine: 0.96 mg/dL — ABNORMAL HIGH (ref 0.51–0.95)
GFR,Black: 75 *
GFR,Caucasian: 65 *
Glucose: 106 mg/dL — ABNORMAL HIGH (ref 60–99)
Lab: 21 mg/dL — ABNORMAL HIGH (ref 6–20)
Potassium: 3.1 mmol/L — ABNORMAL LOW (ref 3.3–5.1)
Sodium: 137 mmol/L (ref 133–145)

## 2019-05-16 LAB — PHOSPHORUS: Phosphorus: 1.8 mg/dL — ABNORMAL LOW (ref 2.7–4.5)

## 2019-05-16 LAB — MAGNESIUM: Magnesium: 2.3 mg/dL (ref 1.6–2.5)

## 2019-05-16 MED ORDER — POTASSIUM CHLORIDE CRYS CR 20 MEQ PO TBCR *I*
40.0000 meq | ORAL_TABLET | ORAL | Status: AC
Start: 2019-05-16 — End: 2019-05-16
  Administered 2019-05-16 (×2): 40 meq via ORAL
  Filled 2019-05-16 (×2): qty 2

## 2019-05-16 NOTE — Progress Notes (Signed)
Current state of emergency related to COVID-19. Documentation completed per hospital emergency response standard.

## 2019-05-16 NOTE — Progress Notes (Signed)
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 SL  Lines/Drains? L hand & R wrist  Tele? Box #6-NSR/ST  Pertinent Labs? WBC-13.9 ,bun cr 21 0.96 but improving   Valuables/ Belongings? Clothing, cell phone & purse  Other Issues? + blood culture 1/4 bottles large gram + bacilli, urine + for legionella antigen  Febrile overnight to 101.22F  Fearful of needles      To Do List  Frequency of:  VS:Q4  Pain: QS  Bed Alarm? Zone No  BG? No  Turn? Independent  Incontinence Check? No  Weight? admission  I/O? QS  Labs? As ordered            Anticipatory Guidance / Discharge Planning  Expected D/C disposition?     Barriers to D/C

## 2019-05-16 NOTE — Progress Notes (Signed)
Inpatient Medicine Progress Note    Interval History:  Fever persist, as well as cough with bloody phlegm. Less dyspnea. Diarrhea x 1 this AM    Intake/Output    Intake/Output Summary (Last 24 hours) at 05/16/2019 1421  Last data filed at 05/16/2019 1050  Gross per 24 hour   Intake 570.32 ml   Output --   Net 570.32 ml        Vital Signs:   Temp:  [36.9 C (98.5 F)-38.9 C (102.1 F)] 36.9 C (98.5 F)  Heart Rate:  [81-102] 81  Resp:  [16] 16  BP: (120-158)/(70-80) 158/80       Physical Exam:    General: ill appearing  HEENT: MMM  Cardiovascular: S1,S2, reg rhythm  Pulmonary: CTA b/l posterior chest wall  Gastrointestinal: obese, soft, NT, +BS  Skin: warm  Psych:pleasant mood  Neuro:grossly normal  TFT:DDUKGUR normal    Data:    Recent Labs   Lab 05/16/19  0258 05/15/19  0327 05/14/19  1555   WBC 13.9* 15.7* 14.5*   Hemoglobin 12.9 14.1 14.3   Hematocrit 41 45 45   Platelets 196 192 179     Other Labs:    Recent Labs   Lab 05/16/19  0258 05/15/19  0327 05/14/19  1555   Sodium 137 138 139   Potassium 3.1* CANCELED 3.1*   Chloride 97 98 97   CO2 24 25 25    UN 21* 40* 42*   Creatinine 0.96* 1.55* 1.89*   GFR,Caucasian 65 36* 29*   GFR,Black 75 42* 33*   Glucose 106* 99 131*   Calcium 8.5* 8.6 8.7      X-rays the past 24 hours: *chest Standard Frontal And Lateral Views    Result Date: 05/14/2019  Diffuse bilateral interstitial markings, most advanced at the left lower lobe. Differential diagnosis includes pulmonary edema, versus developing pneumonia in the left lower lobe. END OF IMPRESSION I have personally reviewed the images and the Resident's/Fellow's interpretation and agree with or edited the findings. [facility] submits this DICOM format image data and final report to the [facility], an independent secure electronic health information exchange, on a reciprocally searchable basis (with patient authorization) for a minimum of 12 months after exam date.       Assessment/Plan  Active  Hospital Problems    Diagnosis    Community acquired pneumonia, unspecified laterality      Resolved Hospital Problems   No resolved problems to display.     1. Legionella pneumonia with hemoptysis  -fever persist, leukocytosis improving  -continue doxycycline IV  -stop ceftriaxone    2. AKI  -corrected  -continue to hold lisinopril    3. Hypokalemia and hypophosphatemia  -correcting  -Mg 2.3  -KPhos 30 mMol    4. HTN, BP elevated  -holding lisinopril  -continue amlodipine    5. Tobacco abuse  -cessation counseling     6. PPX: enoxaparin    7. Code status: full code    8. Disposition: home in AM if defervesce   -quarantine as travel to [place] from [place]  -coordinating care and complexity: 35 minutes    Rico Junker, MD   05/16/2019   2:21 PM

## 2019-05-16 NOTE — Continuity of Care (Signed)
COVID COMMUNICATION CHECKLIST     Has the patient identified a spokesperson to receive updates (Name/Phone Number/Relation)? Yes her sister Josue Hector and Verda Mehta              If applicable, has the spokesperson received SW contact information? Yes              If applicable, has there been an established plan for updates by the medical team with the spokesperson? No pt is able to provide updates to family              If applicable, has the spokesperson received updates from the medical team? NA                Does the patient have a means of personal communication with their spokesperson (i.e. smart phone, cell phone, etc)? Has a cell phone     Is the patient aware of iPad availability for communication purposes? no     Has the patient/spokesperson been informed on the visitation policy? yes     Plan for SW updates with spokesperson: as needed.    Kristopher Oppenheim, MSW pager [phone] 05/16/19 4:02 PM

## 2019-05-16 NOTE — Comprehensive Assessment (Signed)
05/16/19 1543   Contact Information   Spokesperson Name Ysabel Stankovich   Relationship to Patient  Sister   Phone Number(s) [phone]   Has Spokesperson been notified of admission? Yes   Method of Notification Verbal via Telephone   Alternate Spokesperson? Yes   Spokesperson Alternate Big Clifty Caul   Relationship to Patient  Sister   Phone Number(s) [phone]   Emergency Contact other than spokesperson? No   Is spokesperson the patient's caregiver after discharge? Yes   Discharge transportation   Ride to/from facility [facility]   Relationship to Patient  Sister     Sw spoke with pt and her sister, pt could not speak with this Sw for long as she needed to use the bathroom however gave this Sw permission to speak with her sister Norreen. Per Josue Hector Pt was visiting her and became sick.  Josue Hector reported pt was independent with ADLs ans walked with out a device at baseline.  Josue Hector can care for pt post discharge.  No other Sw needs identified at this time.  Sw remains available should d/c planning needs arise.  Kristopher Oppenheim, MSW 05/16/19 3:58 PM pager [phone]

## 2019-05-17 LAB — EKG 12-LEAD
P: 48 deg
PR: 136 ms
QRS: -9 deg
QRSD: 81 ms
QT: 326 ms
QTc: 435 ms
Rate: 107 {beats}/min
T: -14 deg

## 2019-05-17 LAB — BASIC METABOLIC PANEL
Anion Gap: 11 (ref 7–16)
CO2: 27 mmol/L (ref 20–28)
Calcium: 8.8 mg/dL (ref 8.6–10.2)
Chloride: 103 mmol/L (ref 96–108)
Creatinine: 0.68 mg/dL (ref 0.51–0.95)
GFR,Black: 111 *
GFR,Caucasian: 96 *
Glucose: 118 mg/dL — ABNORMAL HIGH (ref 60–99)
Lab: 11 mg/dL (ref 6–20)
Potassium: 3.6 mmol/L (ref 3.3–5.1)
Sodium: 141 mmol/L (ref 133–145)

## 2019-05-17 LAB — CBC AND DIFFERENTIAL
Baso # K/uL: 0.1 10*3/uL (ref 0.0–0.1)
Basophil %: 0.7 %
Eos # K/uL: 0.2 10*3/uL (ref 0.0–0.4)
Eosinophil %: 1.6 %
Hematocrit: 40 % (ref 34–45)
Hemoglobin: 12.7 g/dL (ref 11.2–15.7)
IMM Granulocytes #: 0.4 10*3/uL — ABNORMAL HIGH (ref 0.0–0.0)
IMM Granulocytes: 3.3 %
Lymph # K/uL: 3.3 10*3/uL (ref 1.2–3.7)
Lymphocyte %: 25.6 %
MCH: 25 pg — ABNORMAL LOW (ref 26–32)
MCHC: 32 g/dL (ref 32–36)
MCV: 78 fL — ABNORMAL LOW (ref 79–95)
Mono # K/uL: 1.2 10*3/uL — ABNORMAL HIGH (ref 0.2–0.9)
Monocyte %: 8.9 %
Neut # K/uL: 7.8 10*3/uL — ABNORMAL HIGH (ref 1.6–6.1)
Nucl RBC # K/uL: 0 10*3/uL (ref 0.0–0.0)
Nucl RBC %: 0 /100 WBC (ref 0.0–0.2)
Platelets: 235 10*3/uL (ref 160–370)
RBC: 5.2 MIL/uL (ref 3.9–5.2)
RDW: 15.9 % — ABNORMAL HIGH (ref 11.7–14.4)
Seg Neut %: 59.9 %
WBC: 13 10*3/uL — ABNORMAL HIGH (ref 4.0–10.0)

## 2019-05-17 MED ORDER — AZITHROMYCIN 500 MG PO TABS *I*
500.0000 mg | ORAL_TABLET | Freq: Every day | ORAL | 0 refills | Status: AC
Start: 2019-05-17 — End: 2019-05-24

## 2019-05-17 MED ORDER — DOXYCYCLINE HYCLATE 100 MG PO TABS *I*
100.0000 mg | ORAL_TABLET | Freq: Two times a day (BID) | ORAL | 0 refills | Status: AC
Start: 2019-05-17 — End: 2019-05-24

## 2019-05-17 NOTE — Discharge Instructions (Signed)
Brief Summary of Your Hospital Course (including key procedures and diagnostic test results):  You presented to ED with complains of shortness of breath. You have been treated for pneumonia with IV antibiotics. Your symptoms have improved. You will be discharged home today. Please follow all of your discharge instructions.   Please stay in home quarantine until 05/27/18 given that you came from another state.     Your instructions:  Please follow up with your PCP within a week     New Medications:  Please start Azithromycin 500 mg daily by mouth for 7 more days - total of 10 days      Changes to existing medications:  None     What to do after you leave the hospital:    Recommended diet: Regular - No restrictions     Recommended activity: activity as tolerated    Wound Care: none needed    If you experience any of these symptoms within the first 24 hours after discharge:Uncontrolled pain, Chest pain, Shortness of breath, Fever of 101 F. or greater, Chills or Weakness  please follow up with the discharge attending Dr. Jari Favre at phone-number: [phone]    If you experience any of these symptoms 24 hours or more after discharge:  please follow up with your PCP:  No primary care provider on file. None

## 2019-05-17 NOTE — Progress Notes (Signed)
Inpatient Medicine Progress Note    Interval History:  No dyspnea, no further productive cough    Intake/Output  No intake or output data in the 24 hours ending 05/17/19 1621     Vital Signs:   Temp:  [36.4 C (97.5 F)-37.2 C (99 F)] 36.8 C (98.2 F)  Heart Rate:  [80-82] 80  Resp:  [16-18] 16  BP: (120-122)/(68-80) 122/80       Physical Exam:    General: less ill appearing  HEENT: MMM  Cardiovascular: S1,S2, reg rhythm  Pulmonary: CTA b/l posterior chest wall  Gastrointestinal: obese, soft, NT, +BS  Skin: warm  Psych:pleasant mood  Neuro:grossly normal  PYP:PJKDTOI normal    Data:    Recent Labs   Lab 05/17/19  0614 05/16/19  0258 05/15/19  0327   WBC 13.0* 13.9* 15.7*   Hemoglobin 12.7 12.9 14.1   Hematocrit 40 41 45   Platelets 235 196 192     Other Labs:    Recent Labs   Lab 05/17/19  0614 05/16/19  0258 05/15/19  0327   Sodium 141 137 138   Potassium 3.6 3.1* CANCELED   Chloride 103 97 98   CO2 27 24 25    UN 11 21* 40*   Creatinine 0.68 0.96* 1.55*   GFR,Caucasian 96 65 36*   GFR,Black 111 75 42*   Glucose 118* 106* 99   Calcium 8.8 8.5* 8.6      X-rays the past 24 hours: No results found.     Assessment/Plan  Active Hospital Problems    Diagnosis    Community acquired pneumonia, unspecified laterality      Resolved Hospital Problems   No resolved problems to display.     1. Legionella pneumonia with hemoptysis  -fever resolved, leukocytosis improving  -change doxycycline IV to azithro 500 mg po daily to complete 10 day total treatment  -stop ceftriaxone    2. AKI  -corrected  -resume lisinopril    3. Hypokalemia and hypophosphatemia  -correcting  -Mg 2.3  -s/p KPhos 30 mMol    4. HTN, BP elevated  -restart lisinopril  -continue amlodipine    5. Tobacco abuse  -cessation counseling     6. PPX: enoxaparin    7. Code status: full code    8. Disposition: home today  -quarantine as travel to [place] from [place] x 14 days  -coordinating care and complexity of discharge: 35 minutes    [place], MD   05/17/2019   4:21 PM

## 2019-05-17 NOTE — Progress Notes (Signed)
Case d/w Dr. Dallie Piles and made agreement to change antibiotic to Azithromycin 500 mg daily x 7 days to complete 10 day-course of abx.   A/P - [facility] called and verbal given to Dr. Annamaria Boots. Nursing called and advised to print another AVS due to the above change.

## 2019-05-17 NOTE — Progress Notes (Signed)
Current state of emergency related to COVID-19. Documentation completed per hospital emergency response standard.

## 2019-05-17 NOTE — Discharge Summary (Addendum)
Name: Cassandra Randall MRN: [id number] DOB: [birth year]     Admit Date: 05/14/2019   Date of Discharge: 05/17/2019     Patient was accepted for discharge to   Home or Self Care [1]           Discharge Attending Physician: Rico Junker, MD      Hospitalization Summary    CONCISE NARRATIVE:   Cassandra Randall is a 58 y.o. female with hx of hypertension, tobacco use, and COVID pneumonia in July presenting with 5 days of worsening dyspnea, cough, and warmth feeling.  Legionella pneumonia with hemoptysis - fever persist, treated with CTX/doxycycline IV.  Home quarantine until 05/28/19 as traveled to [place] from [place].  Patient will start Azithromycin 500 mg for 7 more days for total of 10 days of antibiotics, f/u with PCP within a week.  Patient discharged home in a safe medical condition on 05/17/19.                XRAY RESULTS:   Chest, 12/25:  Diffuse bilateral interstitial markings, most advanced at the left lower lobe. Differential diagnosis includes pulmonary edema, versus developing pneumonia in the left lower lobe.         SIGNIFICANT MED CHANGES: Yes  Please start Azithromycin 500 mg daily by mouth for 7 more days - total of 10 days        Signed: Cornelious Bryant, NP  On: 05/17/2019  at: 12:13 PM

## 2019-05-19 LAB — BLOOD CULTURE: Bacterial Blood Culture: 0

## 2019-05-20 LAB — BLOOD CULTURE

## 2019-05-23 LAB — EKG 12-LEAD
P: 42 deg
PR: 124 ms
QRS: -11 deg
QRSD: 80 ms
QT: 301 ms
QTc: 424 ms
Rate: 119 {beats}/min
T: -36 deg

## 2019-12-29 IMAGING — MG MA Digital Screening Mammo
8 series · 8 of 24 positions shown · non-contrast
Comparison: none

INDICATIONS FOR EXAMINATION:                                                              
 Patient History:                                                                          
 Menarche at age 16. Patient has no children. Postmenopausal. Patient used Hormonal        
 Contraceptives                                                                            
 for 3 years.                                                                              
 Brother had prostate cancer at or over age 50. Sister had ovarian cancer at or over age   
 50.                                                                                       
 Last mammogram was performed 5 year(s) and 9 month(s) ago.
REASON FOR EXAM: Screening  (asymptomatic).

[R CC synth-2D]
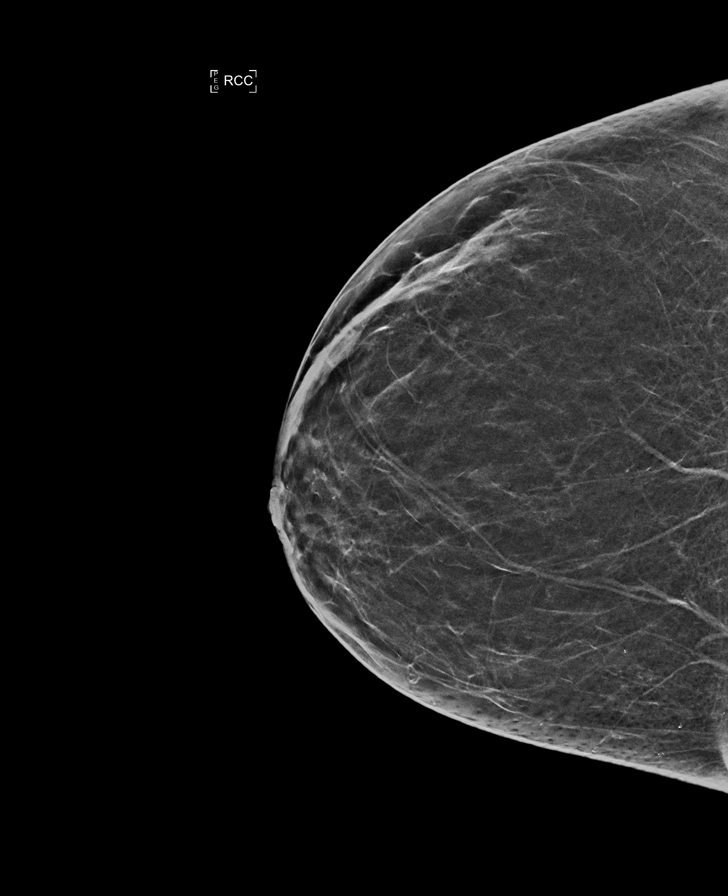

[L MLO synth-2D]
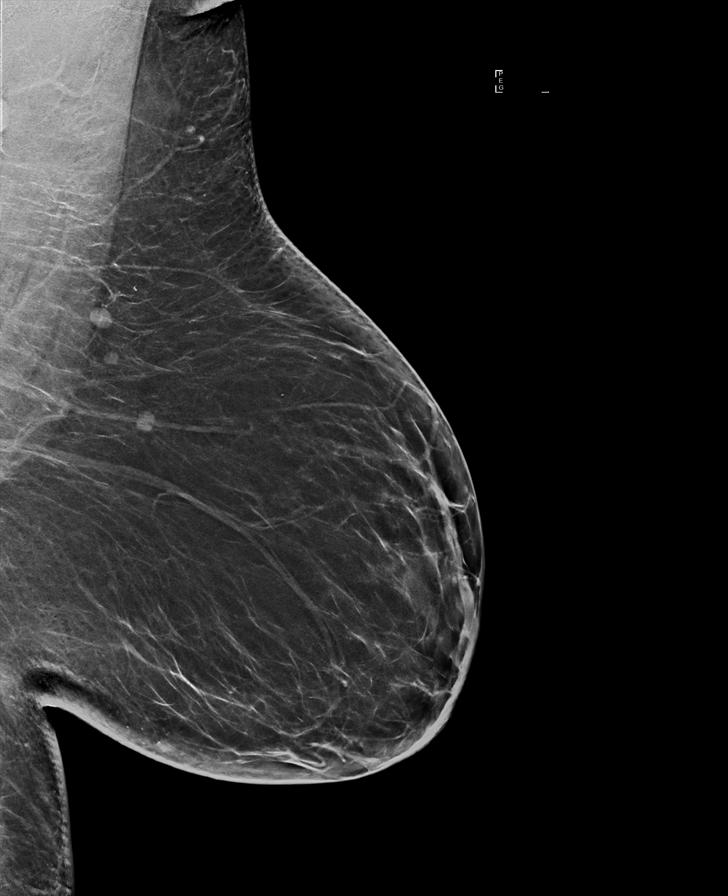

[L CC synth-2D]
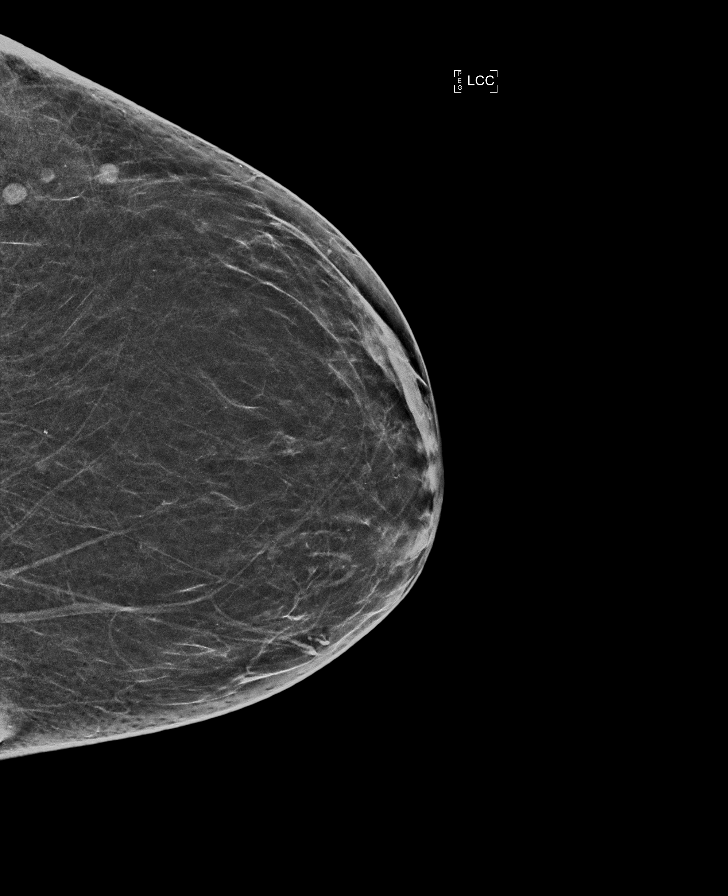

[R MLO synth-2D]
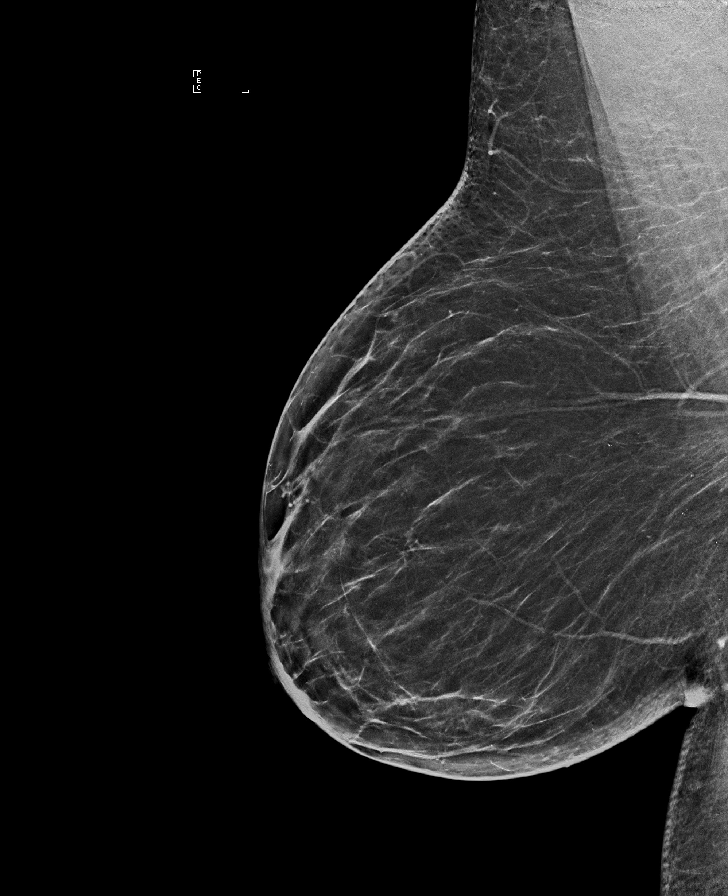

[L CC tomo · tomo slice 27/53.0]
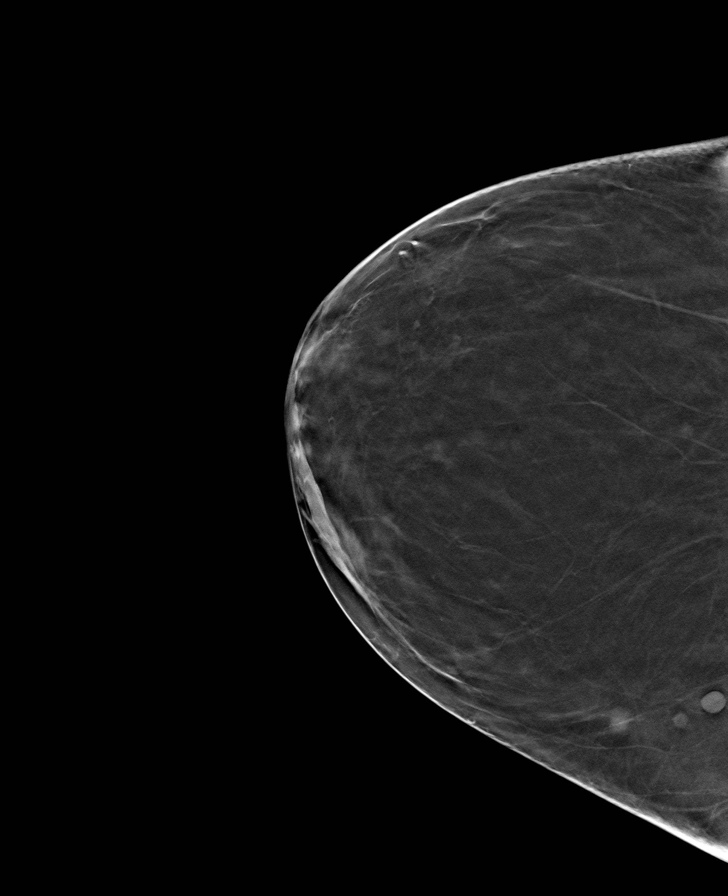

[L MLO tomo · tomo slice 33/66.0]
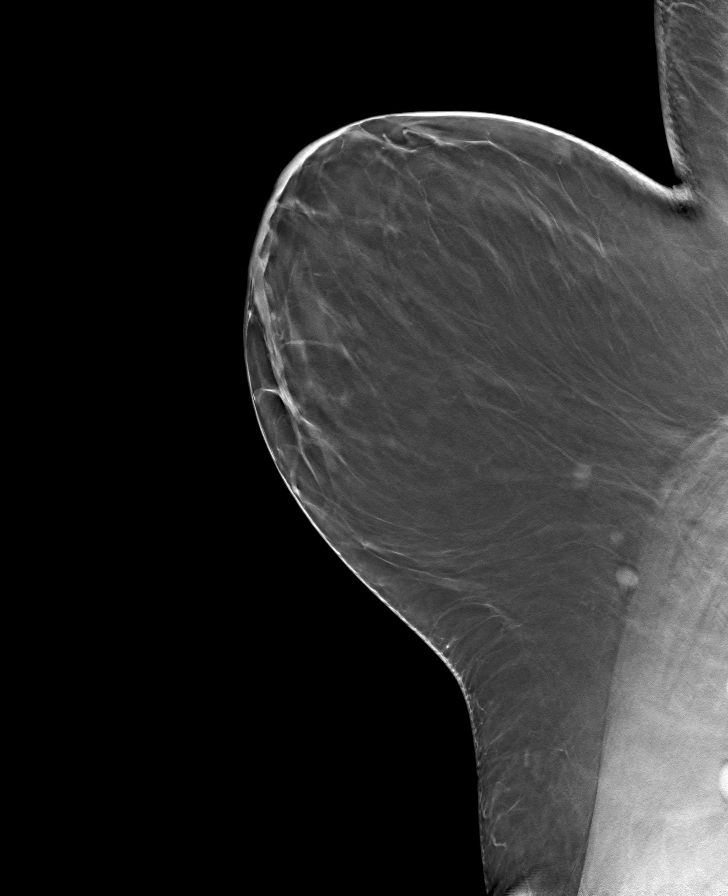

[R MLO tomo · tomo slice 32/63.0]
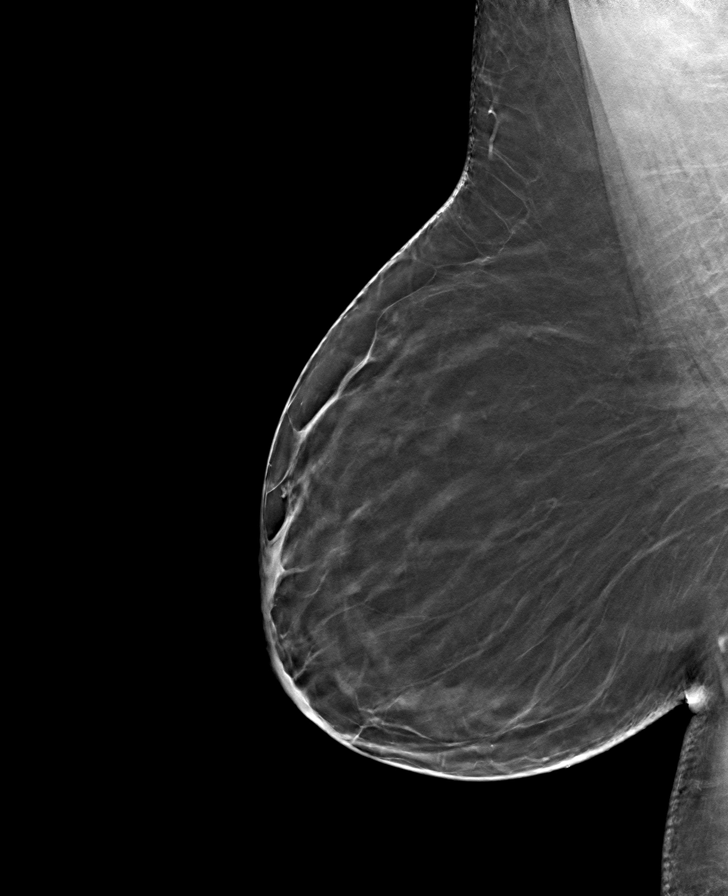

[R CC tomo · tomo slice 27/53.0]
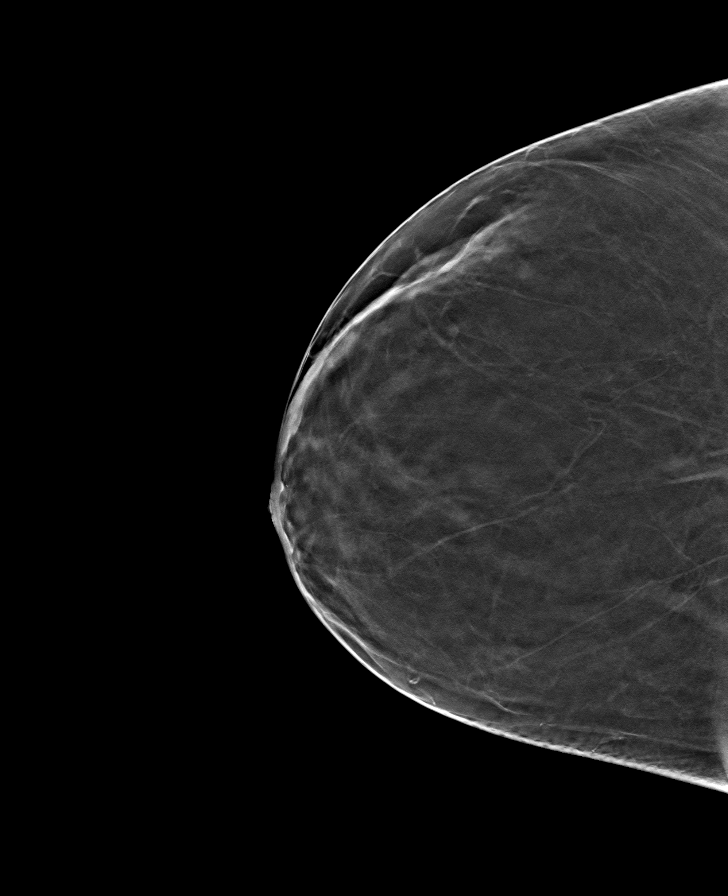

[8 of 24 positions shown; findings below may reference images not displayed]

Risk values:                                                                              
 Tyrer-Cuzick 10 year model risk: 1.8%.                                                    
 Tyrer-Cuzick Lifetime model risk: 3.9%.                                                   
 NCI Lifetime model risk: 5.5%.                                                            
 MRS Risk Manager: No High Risk calculations found at this time.                           

 DIGITAL IMAGE VIEWS:                                                                      
 Bilateral CC views were taken.                                                            
 Bilateral MLO views were taken.                                                           
 Bilateral 3D Tomo views were taken.                                                       

 PRIOR STUDY COMPARISONS                                                                   
 06/04/2010 Bilateral Screening Mammogram, [HOSPITAL]. 04/01/2014  Screening Mammogram, [HOSPITAL].     

 TISSUE DENSITY:                                                                           
 The breasts are almost entirely fatty.
FINDINGS: Analyzed By CAD.                                                                          
 No significant changes when compared with prior studies.                                  
 No suspicious findings in either breast.                                                  

 OVERALL ACR BI-RADS ASSESSMENT: BI-RADS CATEGORY 2: BENIGN                                

 RECOMMENDATION:                                                                           
 Screening Mammogram of both breasts in 1 year.

## 7863-12-19 DEATH — deceased
